# Patient Record
Sex: Male | Born: 1985 | Race: Black or African American | Hispanic: No | Marital: Single | Smoking: Former smoker
Health system: Southern US, Community
[De-identification: ages and names within clinical notes are randomized; demographics above are authoritative.]

---
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
## 2007-08-02 ENCOUNTER — Emergency Department (HOSPITAL_COMMUNITY): Admission: EM | Admit: 2007-08-02 | Discharge: 2007-08-02 | Payer: Self-pay | Admitting: Emergency Medicine

## 2008-01-17 ENCOUNTER — Emergency Department (HOSPITAL_COMMUNITY): Admission: EM | Admit: 2008-01-17 | Discharge: 2008-01-18 | Payer: Self-pay | Admitting: Emergency Medicine

## 2008-11-06 ENCOUNTER — Emergency Department (HOSPITAL_COMMUNITY): Admission: EM | Admit: 2008-11-06 | Discharge: 2008-11-06 | Payer: Self-pay | Admitting: Emergency Medicine

## 2010-08-15 IMAGING — CR DG KNEE COMPLETE 4+V*L*
4 series · 4 of 4 positions shown · non-contrast
Comparison: None

CLINICAL DATA: Fall with left knee pain and swelling.

LEFT KNEE - COMPLETE 4+ VIEW

[t knee ap left]
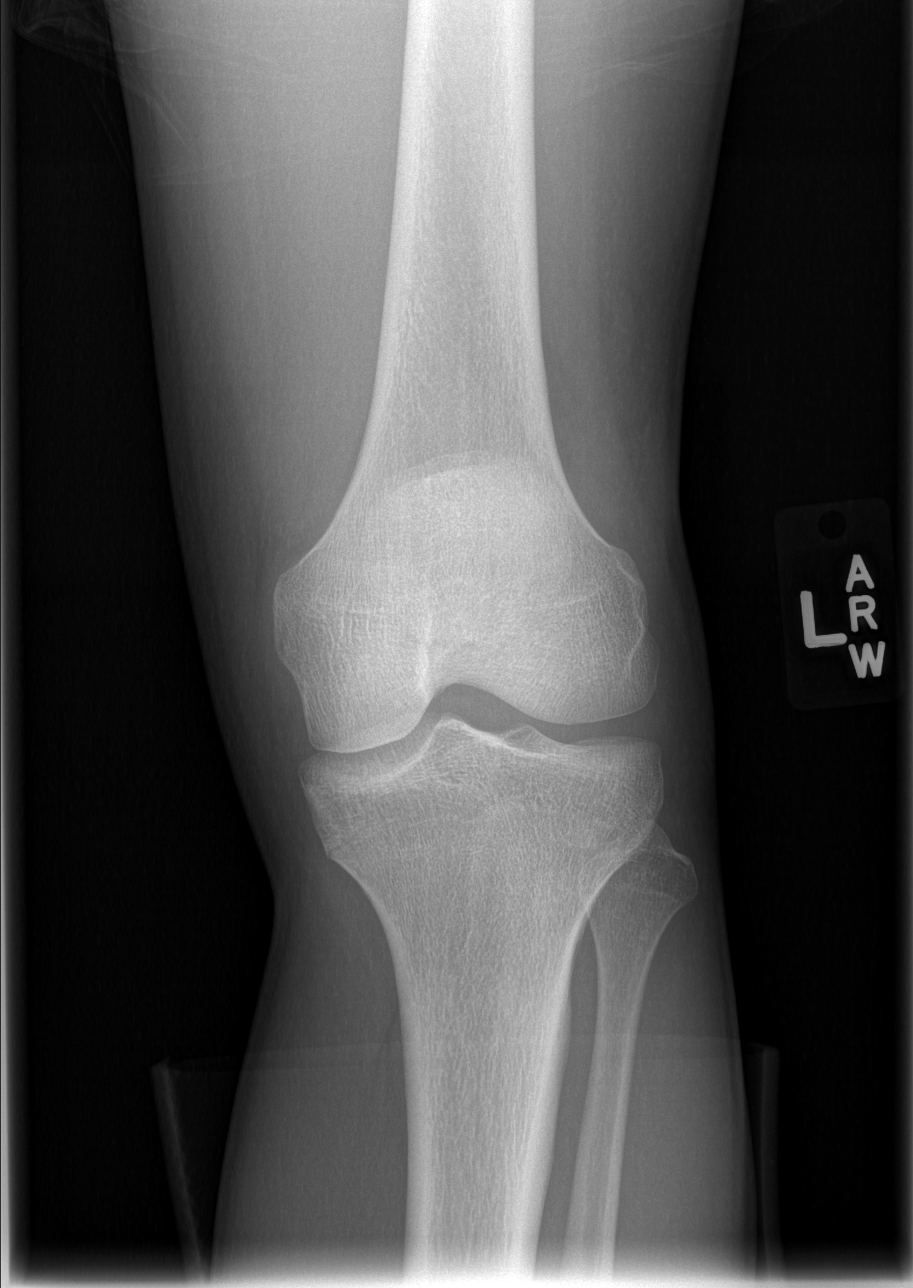

[t knee oblique left (1 of 2)]
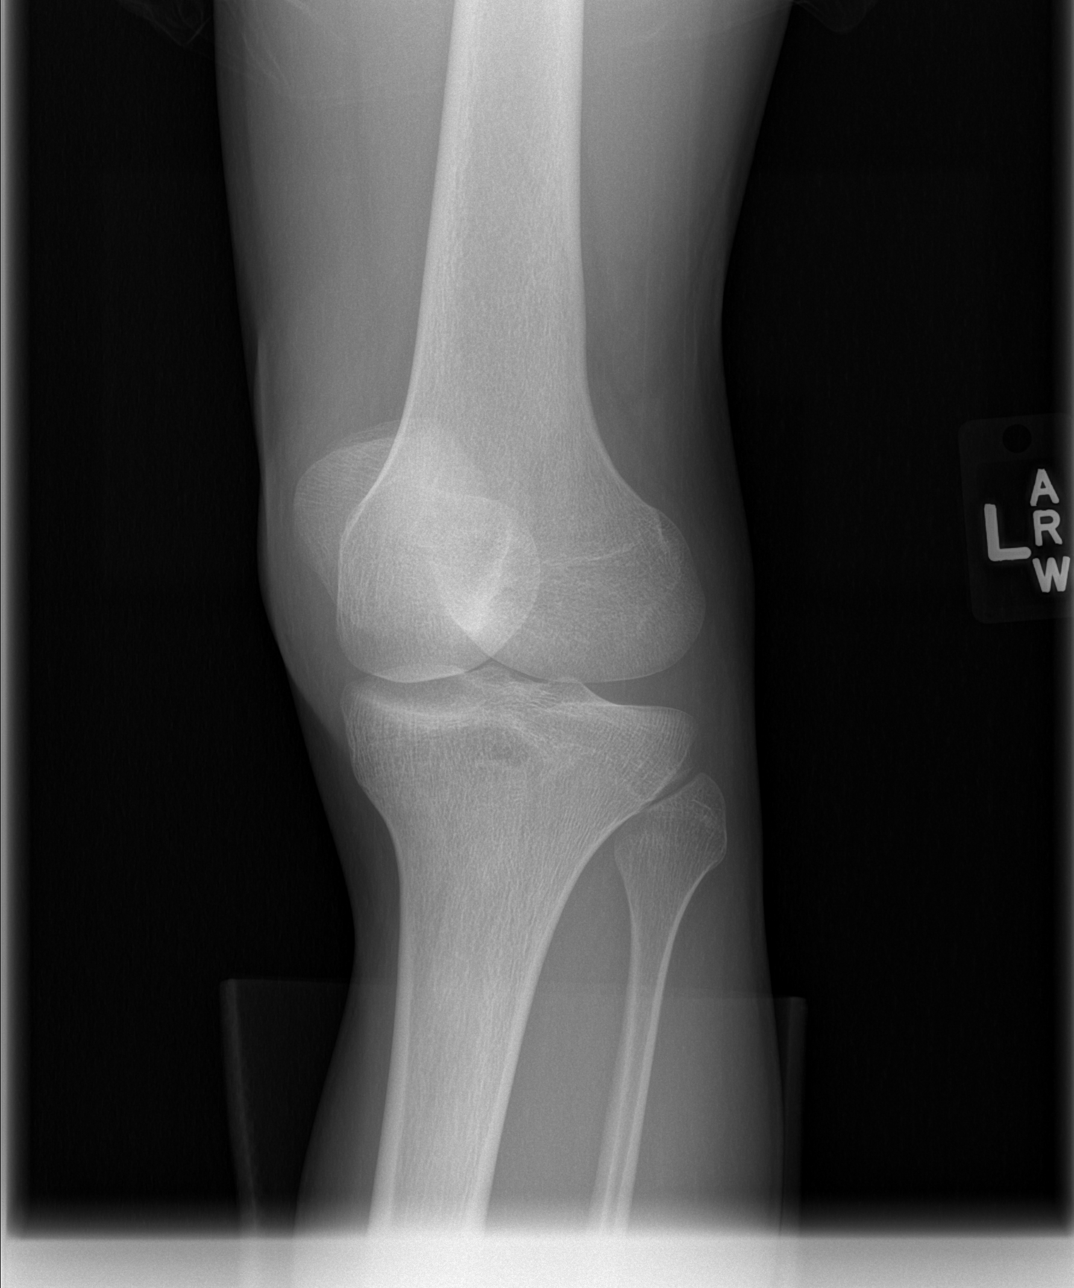

[t knee oblique left (2 of 2)]
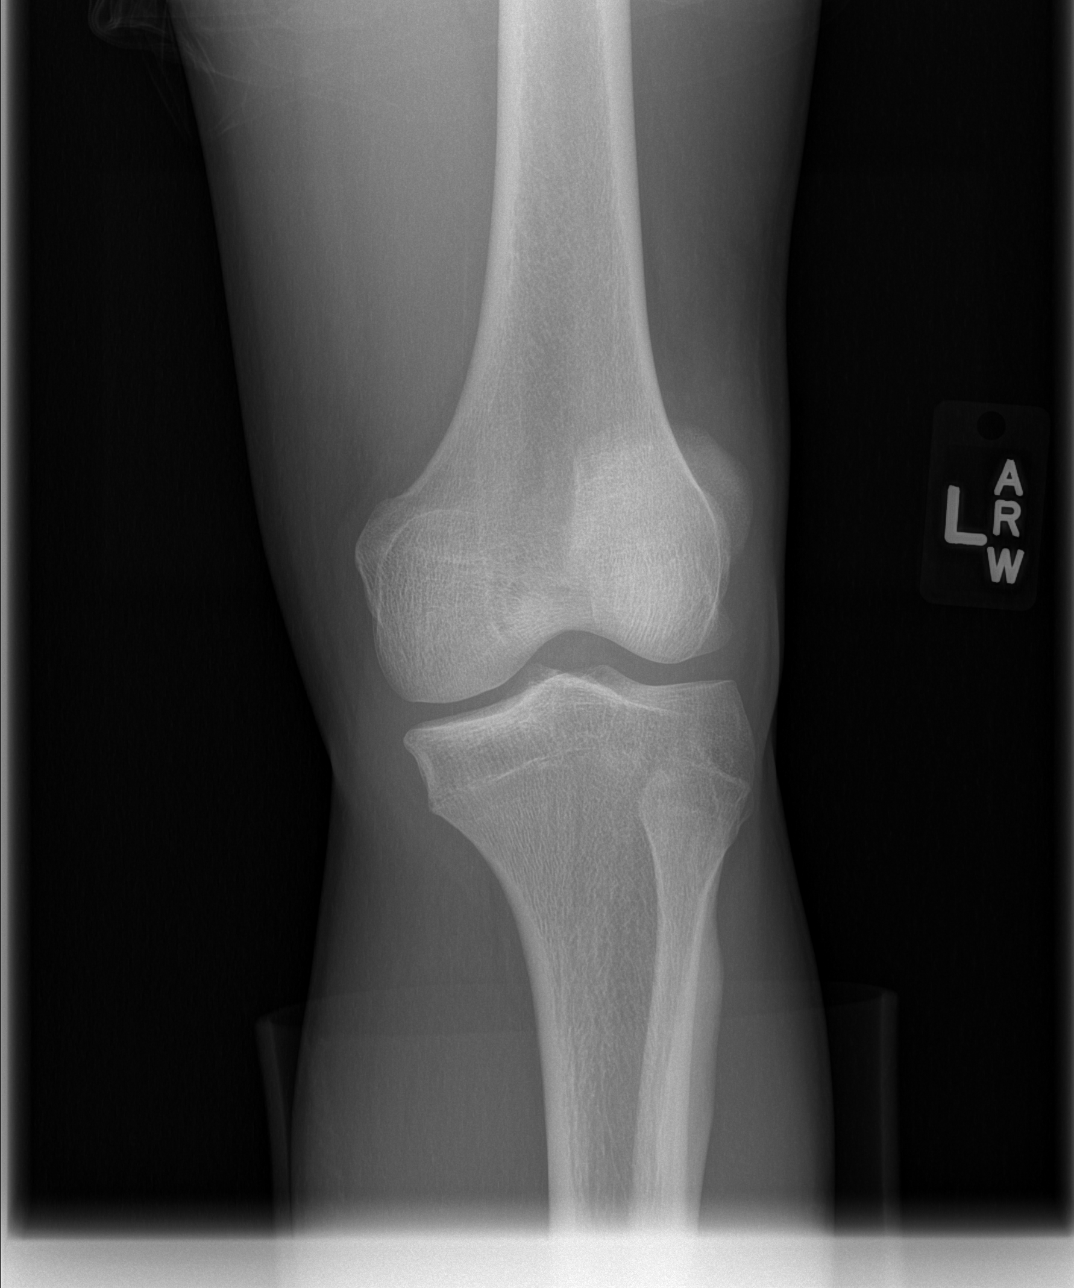

[t knee lat left]
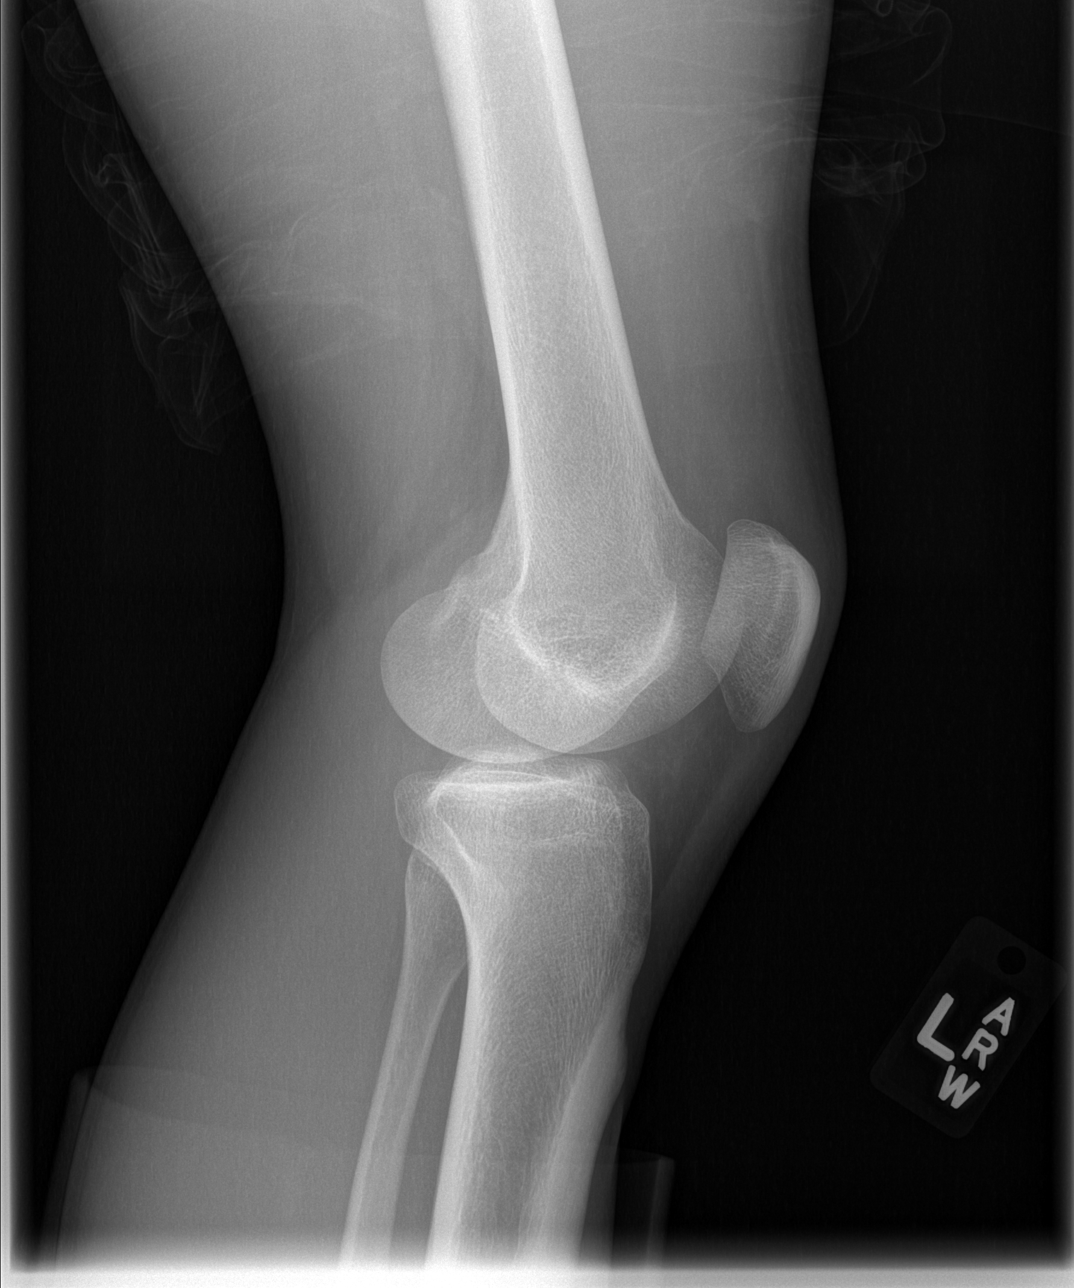

[4 of 4 positions shown; findings below may reference images not displayed]

FINDINGS: Four views of the left knee were obtained.  The left knee
is located without acute fracture.  It is difficult to evaluate for
a suprapatellar joint effusion.  There is no significant
degenerative change.
IMPRESSION: Negative radiographs of the left knee.

## 2011-03-05 ENCOUNTER — Encounter: Payer: Self-pay | Admitting: *Deleted

## 2011-03-05 ENCOUNTER — Emergency Department (HOSPITAL_COMMUNITY)
Admission: EM | Admit: 2011-03-05 | Discharge: 2011-03-05 | Disposition: A | Discharge status: Home / Self Care | Payer: No Typology Code available for payment source | Attending: Emergency Medicine | Admitting: Emergency Medicine

## 2011-03-05 ENCOUNTER — Emergency Department (HOSPITAL_COMMUNITY): Payer: No Typology Code available for payment source

## 2011-03-05 DIAGNOSIS — S82109A Unspecified fracture of upper end of unspecified tibia, initial encounter for closed fracture: Secondary | ICD-10-CM | POA: Insufficient documentation

## 2011-03-05 DIAGNOSIS — X58XXXA Exposure to other specified factors, initial encounter: Secondary | ICD-10-CM | POA: Insufficient documentation

## 2011-03-05 DIAGNOSIS — S93409A Sprain of unspecified ligament of unspecified ankle, initial encounter: Secondary | ICD-10-CM

## 2011-03-05 DIAGNOSIS — M25579 Pain in unspecified ankle and joints of unspecified foot: Secondary | ICD-10-CM | POA: Insufficient documentation

## 2011-03-05 DIAGNOSIS — S82209A Unspecified fracture of shaft of unspecified tibia, initial encounter for closed fracture: Secondary | ICD-10-CM

## 2011-03-05 DIAGNOSIS — M25569 Pain in unspecified knee: Secondary | ICD-10-CM | POA: Insufficient documentation

## 2011-03-05 MED ORDER — IBUPROFEN 800 MG PO TABS
800.0000 mg | ORAL_TABLET | Freq: Once | ORAL | Status: AC
  Administered 2011-03-05: 800 mg via ORAL
  Filled 2011-03-05: qty 1

## 2011-03-05 MED ORDER — HYDROCODONE-ACETAMINOPHEN 5-500 MG PO TABS: 1.0000 | ORAL_TABLET | Freq: Four times a day (QID) | ORAL | Status: AC | PRN

## 2011-03-05 NOTE — ED Notes (Signed)
Pt states "Jumped and all my weight came down on my knee & hyperextended"; pt indicates right knee

## 2011-03-05 NOTE — ED Provider Notes (Signed)
History     CSN: [id number] Arrival date & time: 03/05/2011  8:04 AM   First MD Initiated Contact with Patient 03/05/11 [phone]      Chief Complaint  Patient presents with  . Knee Injury    (Consider location/radiation/quality/duration/timing/severity/associated sxs/prior treatment) Patient is a 25 y.o. male presenting with knee pain.  Knee Pain This is a new problem. The current episode started yesterday. The problem occurs constantly. The problem has been unchanged. Pertinent negatives include no anorexia, chills, coughing, fatigue, headaches, nausea, rash, urinary symptoms or vomiting. The symptoms are aggravated by bending and exertion. He has tried nothing for the symptoms.    PT states that he was jumping and landed on his right leg awkwardly injuring his right knee and right ankle. He states that he is ambulatory but with pain.   History reviewed. No pertinent past medical history.  History reviewed. No pertinent past surgical history.  No family history on file.  History  Substance Use Topics  . Smoking status: Former Games developer  . Smokeless tobacco: Not on file  . Alcohol Use: Yes     ocassionally      Review of Systems  Constitutional: Negative for chills and fatigue.  Respiratory: Negative for cough.   Gastrointestinal: Negative for nausea, vomiting and anorexia.  Skin: Negative for rash.  Neurological: Negative for headaches.  All other systems reviewed and are negative.    Allergies  Review of patient's allergies indicates no known allergies.  Home Medications   Current Outpatient Rx  Name Route Sig Dispense Refill  . ACETAMINOPHEN 500 MG PO TABS Oral Take 1,000 mg by mouth every 6 (six) hours as needed. For pain      BP 114/70  Pulse 65  Temp(Src) 98.5 F (36.9 C) (Oral)  Resp 18  Wt 130 lb (58.968 kg)  SpO2 99%  Physical Exam  Nursing note and vitals reviewed. Constitutional: He is oriented to person, place, and time. He appears  well-developed and well-nourished.  HENT:  Head: Normocephalic and atraumatic.  Eyes: EOM are normal. Pupils are equal, round, and reactive to light.  Neck: Normal range of motion.  Cardiovascular: Normal rate and regular rhythm.   Pulmonary/Chest: Effort normal and breath sounds normal.  Musculoskeletal: Normal range of motion.       Legs: Neurological: He is alert and oriented to person, place, and time.  Skin: Skin is warm and dry.    ED Course  Procedures (including critical care time)  Labs Reviewed - No data to display Dg Ankle Complete Right  03/05/2011  *RADIOLOGY REPORT*  Clinical Data: Injured jumping, pain  RIGHT ANKLE - COMPLETE 3+ VIEW  Comparison: None.  Findings: No acute fracture is seen.  The ankle joint appears normal.  Alignment is normal  IMPRESSION: Negative.  Original Report Authenticated By: Juline Patch, M.D.   Dg Knee Complete 4 Views Right  03/05/2011  *RADIOLOGY REPORT*  Clinical Data: Hyperextended knee, pain  RIGHT KNEE - COMPLETE 4+ VIEW  Comparison: None.  Findings: The knee joint spaces appear normal. On the lateral view there is a bony density along the anterior aspect of the proximal right tibia at the knee joint.  This is not a typical location of a ossified ossicle and a small avulsion fracture fragment is a consideration.  ACL injury would be a consideration as well.  There does appear to be a knee joint effusion present.  IMPRESSION: Suspect small fracture from the anterior aspect of the proximal right tibia  at the knee joint with knee joint effusion.  Consider MRI if further assessment is warranted.  Question ACL injury.  Original Report Authenticated By: Juline Patch, M.D.     No diagnosis found.    MDM  Pt has a small fracture of right proximal tiba. Will give knee immobilizer and have pt follow-up with Ortho.        Dorthula Matas, PA 03/05/11 1016

## 2011-03-05 NOTE — ED Provider Notes (Signed)
Medical screening examination/treatment/procedure(s) were performed by non-physician practitioner and as supervising physician I was immediately available for consultation/collaboration.   Ernestene Coover M Lurlene Ronda, DO 03/05/11 2006 

## 2011-04-03 ENCOUNTER — Encounter (HOSPITAL_COMMUNITY): Payer: Self-pay | Admitting: Adult Health

## 2011-04-03 ENCOUNTER — Emergency Department (HOSPITAL_COMMUNITY)
Admission: EM | Admit: 2011-04-03 | Discharge: 2011-04-03 | Disposition: A | Discharge status: Home / Self Care | Payer: PRIVATE HEALTH INSURANCE | Attending: Emergency Medicine | Admitting: Emergency Medicine

## 2011-04-03 DIAGNOSIS — IMO0001 Reserved for inherently not codable concepts without codable children: Secondary | ICD-10-CM | POA: Insufficient documentation

## 2011-04-03 DIAGNOSIS — R0981 Nasal congestion: Secondary | ICD-10-CM

## 2011-04-03 DIAGNOSIS — J3489 Other specified disorders of nose and nasal sinuses: Secondary | ICD-10-CM | POA: Insufficient documentation

## 2011-04-03 MED ORDER — FLUTICASONE PROPIONATE 50 MCG/ACT NA SUSP: 2.0000 | Freq: Every day | NASAL | Status: AC

## 2011-04-03 NOTE — ED Notes (Signed)
Runny ose, congestion and sneezing associated with body aches that began one week ago.

## 2011-04-03 NOTE — ED Provider Notes (Signed)
History     CSN: [id number]  Arrival date & time 04/03/11  9147   First MD Initiated Contact with Patient 04/03/11 0948    10:00 AM HPI Patient reports having a burst retract infection for about a week. Symptoms associated include nasal congestion, rhinorrhea, and sneezing. States he's here because "every time I mean my body aches." Currently denies any pain. States she's tried over-the-counter Tylenol and decongestants without relief. Patient is a 25 y.o. male presenting with URI.  URI The primary symptoms include sore throat and myalgias. Primary symptoms do not include fever, ear pain, cough, wheezing, abdominal pain, nausea or vomiting. The current episode started 6 to 7 days ago. This is a new problem. The problem has not changed since onset. The myalgias are generalized. The myalgias are aching (Only when sneezing). The discomfort from the myalgias is mild.  Symptoms associated with the illness include congestion and rhinorrhea. The illness is not associated with chills or sinus pressure. The following treatments were addressed: Acetaminophen was ineffective. A decongestant was ineffective. Aspirin was ineffective.    History reviewed. No pertinent past medical history.  History reviewed. No pertinent past surgical history.  History reviewed. No pertinent family history.  History  Substance Use Topics  . Smoking status: Former Games developer  . Smokeless tobacco: Not on file  . Alcohol Use: Yes     ocassionally      Review of Systems  Constitutional: Negative for fever and chills.  HENT: Positive for congestion, sore throat, rhinorrhea and sneezing. Negative for ear pain, neck pain, postnasal drip and sinus pressure.   Respiratory: Negative for cough, shortness of breath and wheezing.   Cardiovascular: Negative for chest pain.  Gastrointestinal: Negative for nausea, vomiting and abdominal pain.  Musculoskeletal: Positive for myalgias.  All other systems reviewed and are  negative.    Allergies  Review of patient's allergies indicates no known allergies.  Home Medications   Current Outpatient Rx  Name Route Sig Dispense Refill  . ACETAMINOPHEN 500 MG PO TABS Oral Take 1,000 mg by mouth every 6 (six) hours as needed. For pain    . PHENYLEPHRINE-APAP-GUAIFENESIN 5-325-200 MG/15ML PO LIQD Oral Take 15 mLs by mouth every 8 (eight) hours as needed. Flu symptoms       BP 144/84  Pulse 69  Temp(Src) 97.8 F (36.6 C) (Oral)  Resp 20  SpO2 98%  Physical Exam  Vitals reviewed. Constitutional: He is oriented to person, place, and time. He appears well-developed and well-nourished.  HENT:  Head: Normocephalic and atraumatic.  Right Ear: Tympanic membrane, external ear and ear canal normal.  Left Ear: External ear normal.  Nose: Nose normal. Right sinus exhibits no maxillary sinus tenderness and no frontal sinus tenderness. Left sinus exhibits no maxillary sinus tenderness and no frontal sinus tenderness.  Mouth/Throat: Uvula is midline and oropharynx is clear and moist. No oropharyngeal exudate.  Eyes: Conjunctivae are normal. Pupils are equal, round, and reactive to light.  Neck: Normal range of motion. Neck supple.  Cardiovascular: Normal rate, regular rhythm and normal heart sounds.  Exam reveals no gallop and no friction rub.   No murmur heard. Pulmonary/Chest: Effort normal and breath sounds normal. He has no wheezes. He has no rales. He exhibits no tenderness.  Abdominal: Soft. Bowel sounds are normal.  Neurological: He is alert and oriented to person, place, and time.  Skin: Skin is warm and dry. No rash noted. No erythema. No pallor.  Psychiatric: He has a normal mood and affect.  His behavior is normal.    ED Course  Procedures   MDM         Thomasene Lot, [place] 04/03/11 1007

## 2011-04-04 NOTE — ED Provider Notes (Signed)
Medical screening examination/treatment/procedure(s) were performed by non-physician practitioner and as supervising physician I was immediately available for consultation/collaboration.   Suzi Roots, MD 04/04/11 [phone]

## 2014-03-04 ENCOUNTER — Emergency Department (HOSPITAL_COMMUNITY)
Admission: EM | Admit: 2014-03-04 | Discharge: 2014-03-04 | Disposition: A | Discharge status: Home / Self Care | Payer: PRIVATE HEALTH INSURANCE | Attending: Emergency Medicine | Admitting: Emergency Medicine

## 2014-03-04 ENCOUNTER — Encounter (HOSPITAL_COMMUNITY): Payer: Self-pay | Admitting: Emergency Medicine

## 2014-03-04 DIAGNOSIS — Z87891 Personal history of nicotine dependence: Secondary | ICD-10-CM | POA: Insufficient documentation

## 2014-03-04 DIAGNOSIS — Z7951 Long term (current) use of inhaled steroids: Secondary | ICD-10-CM | POA: Insufficient documentation

## 2014-03-04 DIAGNOSIS — R21 Rash and other nonspecific skin eruption: Secondary | ICD-10-CM | POA: Insufficient documentation

## 2014-03-04 MED ORDER — HYDROCORTISONE VALERATE 0.2 % EX OINT: 1.0000 "application " | TOPICAL_OINTMENT | Freq: Two times a day (BID) | CUTANEOUS | Status: AC

## 2014-03-04 NOTE — ED Notes (Signed)
Pt report itching rash that started right neck and spread  now to right arm pit. Pt denies known allergies. Denies fever. Pt presents with dry rash, no redness or sign of infection.

## 2014-03-04 NOTE — Discharge Instructions (Signed)
Use vasoline on areas and benadryl every hrs as needed. If no improvement in 1 week try topical steroids for 1 week but not on face or genitals.  If you were given medicines take as directed.  If you are on coumadin or contraceptives realize their levels and effectiveness is altered by many different medicines.  If you have any reaction (rash, tongues swelling, other) to the medicines stop taking and see a physician.   Please follow up as directed and return to the ER or see a physician for new or worsening symptoms.  Thank you. Filed Vitals:   03/04/14 0856  BP: 118/64  Pulse: 60  Temp: 97.6 F (36.4 C)  TempSrc: Oral  Resp: 16  SpO2: 99%

## 2014-03-08 NOTE — ED Provider Notes (Signed)
CSN: [id number]     Arrival date & time 03/04/14  [id number] History   First MD Initiated Contact with Patient 03/04/14 [phone]     Chief Complaint  Patient presents with  . Rash     (Consider location/radiation/quality/duration/timing/severity/associated sxs/prior Treatment) HPI Comments: 28 year-old male with no significant adequate history presents with itchy rash for the past week that started on the right neck and now has spread to the axilla and flanks. No history of similar Patient recently body new shirt but no other new exposures area no history of significant allergies. No breathing difficulties.   Patient is a 28 y.o. male presenting with rash. The history is provided by the patient.  Rash Associated symptoms: no abdominal pain, no fever, no shortness of breath and not vomiting     History reviewed. No pertinent past medical history. History reviewed. No pertinent past surgical history. No family history on file. History  Substance Use Topics  . Smoking status: Former Games developermoker  . Smokeless tobacco: Not on file  . Alcohol Use: Yes     Comment: ocassionally    Review of Systems  Constitutional: Negative for fever and chills.  Respiratory: Negative for shortness of breath.   Cardiovascular: Negative for chest pain.  Gastrointestinal: Negative for vomiting and abdominal pain.  Musculoskeletal: Negative for neck pain and neck stiffness.  Skin: Positive for rash.      Allergies  Review of patient's allergies indicates no known allergies.  Home Medications   Prior to Admission medications   Medication Sig Start Date End Date Taking? Authorizing Provider  acetaminophen (TYLENOL) 500 MG tablet Take 1,000 mg by mouth every 6 (six) hours as needed. For pain    Historical Provider, MD  fluticasone (FLONASE) 50 MCG/ACT nasal spray Place 2 sprays into the nose daily. 04/03/11 04/02/12  Thomasene LotBrigitte Nguyen, PA-C  hydrocortisone valerate ointment (WESTCORT) 0.2 % Apply 1 application  topically 2 (two) times daily. 03/04/14   Enid SkeensJoshua M Telesha Deguzman, MD  Phenylephrine-APAP-Guaifenesin [phone] MG/15ML LIQD Take 15 mLs by mouth every 8 (eight) hours as needed. Flu symptoms     Historical Provider, MD   BP 118/64 mmHg  Pulse 60  Temp(Src) 97.6 F (36.4 C) (Oral)  Resp 16  SpO2 99% Physical Exam  Constitutional: He is oriented to person, place, and time. He appears well-developed and well-nourished.  HENT:  Head: Normocephalic and atraumatic.  No angioedema  Eyes: Right eye exhibits no discharge. Left eye exhibits no discharge.  Neck: Normal range of motion. Neck supple. No tracheal deviation present.  Cardiovascular: Normal rate.   Pulmonary/Chest: Effort normal and breath sounds normal.  Abdominal: Soft. He exhibits no distension. There is no tenderness. There is no guarding.  Musculoskeletal: He exhibits no edema.  Neurological: He is alert and oriented to person, place, and time.  Skin: Skin is warm. No rash noted.  Patient has multiple areas of dry skin, mild excoriations and mild papules on lateral right neck and bilateral flanks. No petechia or purpura, no cellulitis.  Psychiatric: He has a normal mood and affect.  Nursing note and vitals reviewed.   ED Course  Procedures (including critical care time) Labs Review Labs Reviewed - No data to display  Imaging Review No results found.   EKG Interpretation None      MDM   Final diagnoses:  Rash and nonspecific skin eruption   Well-appearing male with nonspecific dermatitis like rash. Discussed Vaseline and trial of topical steroids. Follow-up outpatient discussed. Discussed getting rid of  new shirt.  Results and differential diagnosis were discussed with the patient/parent/guardian. Close follow up outpatient was discussed, comfortable with the plan.   Medications - No data to display  Filed Vitals:   03/04/14 0856  BP: 118/64  Pulse: 60  Temp: 97.6 F (36.4 C)  TempSrc: Oral  Resp: 16  SpO2:  99%    Final diagnoses:  Rash and nonspecific skin eruption       Enid SkeensJoshua M Amalie Koran, MD 03/08/14 [phone]

## 2015-04-27 ENCOUNTER — Encounter (HOSPITAL_COMMUNITY): Payer: Self-pay | Admitting: Emergency Medicine

## 2015-04-27 ENCOUNTER — Emergency Department (HOSPITAL_COMMUNITY)
Admission: EM | Admit: 2015-04-27 | Discharge: 2015-04-27 | Disposition: A | Discharge status: Home / Self Care | Payer: PRIVATE HEALTH INSURANCE | Attending: Emergency Medicine | Admitting: Emergency Medicine

## 2015-04-27 DIAGNOSIS — Y92410 Unspecified street and highway as the place of occurrence of the external cause: Secondary | ICD-10-CM | POA: Insufficient documentation

## 2015-04-27 DIAGNOSIS — Y9389 Activity, other specified: Secondary | ICD-10-CM | POA: Insufficient documentation

## 2015-04-27 DIAGNOSIS — M546 Pain in thoracic spine: Secondary | ICD-10-CM

## 2015-04-27 DIAGNOSIS — Z87891 Personal history of nicotine dependence: Secondary | ICD-10-CM | POA: Insufficient documentation

## 2015-04-27 DIAGNOSIS — S29002A Unspecified injury of muscle and tendon of back wall of thorax, initial encounter: Secondary | ICD-10-CM | POA: Insufficient documentation

## 2015-04-27 DIAGNOSIS — Z7952 Long term (current) use of systemic steroids: Secondary | ICD-10-CM | POA: Insufficient documentation

## 2015-04-27 DIAGNOSIS — S4992XA Unspecified injury of left shoulder and upper arm, initial encounter: Secondary | ICD-10-CM | POA: Insufficient documentation

## 2015-04-27 DIAGNOSIS — Y998 Other external cause status: Secondary | ICD-10-CM | POA: Insufficient documentation

## 2015-04-27 DIAGNOSIS — S6991XA Unspecified injury of right wrist, hand and finger(s), initial encounter: Secondary | ICD-10-CM | POA: Insufficient documentation

## 2015-04-27 MED ORDER — ACETAMINOPHEN 325 MG PO TABS
650.0000 mg | ORAL_TABLET | Freq: Once | ORAL | Status: AC
  Administered 2015-04-27: 650 mg via ORAL
  Filled 2015-04-27: qty 2

## 2015-04-27 MED ORDER — IBUPROFEN 800 MG PO TABS
800.0000 mg | ORAL_TABLET | Freq: Once | ORAL | Status: AC
  Administered 2015-04-27: 800 mg via ORAL
  Filled 2015-04-27: qty 1

## 2015-04-27 MED ORDER — METHOCARBAMOL 500 MG PO TABS: 500.0000 mg | ORAL_TABLET | Freq: Two times a day (BID) | ORAL | Status: AC

## 2015-04-27 MED ORDER — IBUPROFEN 800 MG PO TABS: 800.0000 mg | ORAL_TABLET | Freq: Three times a day (TID) | ORAL | Status: AC
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
## 2020-12-12 ENCOUNTER — Encounter (HOSPITAL_BASED_OUTPATIENT_CLINIC_OR_DEPARTMENT_OTHER): Payer: Self-pay

## 2020-12-12 ENCOUNTER — Emergency Department (HOSPITAL_BASED_OUTPATIENT_CLINIC_OR_DEPARTMENT_OTHER)
Admission: EM | Admit: 2020-12-12 | Discharge: 2020-12-12 | Disposition: A | Discharge status: Home / Self Care | Payer: No Typology Code available for payment source | Attending: Emergency Medicine | Admitting: Emergency Medicine

## 2020-12-12 ENCOUNTER — Emergency Department (HOSPITAL_BASED_OUTPATIENT_CLINIC_OR_DEPARTMENT_OTHER): Payer: No Typology Code available for payment source

## 2020-12-12 ENCOUNTER — Other Ambulatory Visit: Payer: Self-pay

## 2020-12-12 DIAGNOSIS — R519 Headache, unspecified: Secondary | ICD-10-CM | POA: Diagnosis not present

## 2020-12-12 DIAGNOSIS — S00501A Unspecified superficial injury of lip, initial encounter: Secondary | ICD-10-CM | POA: Diagnosis present

## 2020-12-12 DIAGNOSIS — S161XXA Strain of muscle, fascia and tendon at neck level, initial encounter: Secondary | ICD-10-CM

## 2020-12-12 DIAGNOSIS — Z87891 Personal history of nicotine dependence: Secondary | ICD-10-CM | POA: Insufficient documentation

## 2020-12-12 DIAGNOSIS — Y92410 Unspecified street and highway as the place of occurrence of the external cause: Secondary | ICD-10-CM | POA: Diagnosis not present

## 2020-12-12 DIAGNOSIS — S40012A Contusion of left shoulder, initial encounter: Secondary | ICD-10-CM

## 2020-12-12 DIAGNOSIS — S01511A Laceration without foreign body of lip, initial encounter: Secondary | ICD-10-CM | POA: Diagnosis not present

## 2020-12-12 NOTE — ED Notes (Signed)
Patient transported to X-ray 
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
## 2020-12-12 NOTE — Discharge Instructions (Addendum)
CT head CT face and neck without any acute bony abnormalities.  X-ray of the left shoulder without any bony abnormalities.  We discussed about the laceration to the inner lip.  This will heal on its own.  Spicy foods or foods with acid like citrus will will burn that.  Recommend taking Motrin or Naprosyn for the muscle contusions.  And strains.  Work note provided.  Return for any new or worse symptoms.  Follow-up with sports medicine if not improving over the next week.  Information provided above.
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
## 2020-12-17 ENCOUNTER — Other Ambulatory Visit: Payer: Self-pay

## 2020-12-17 ENCOUNTER — Ambulatory Visit (INDEPENDENT_AMBULATORY_CARE_PROVIDER_SITE_OTHER): Payer: Self-pay | Admitting: Family Medicine

## 2020-12-17 ENCOUNTER — Encounter: Payer: Self-pay | Admitting: Family Medicine

## 2020-12-17 VITALS — Ht 64.0 in | Wt 135.0 lb

## 2020-12-17 DIAGNOSIS — S0083XA Contusion of other part of head, initial encounter: Secondary | ICD-10-CM | POA: Insufficient documentation
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
## 2020-12-17 NOTE — Patient Instructions (Signed)
Nice to meet you Please continue to be active  Please use heat as needed   Please send me a message in MyChart with any questions or updates.  Please see me back as needed.   --Dr. Jordan Likes
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
## 2022-07-22 ENCOUNTER — Encounter: Payer: Self-pay | Admitting: *Deleted
